# Patient Record
Sex: Female | Born: 1965
Health system: Southern US, Community
[De-identification: ages and names within clinical notes are randomized; demographics above are authoritative.]

## PROBLEM LIST (undated history)

## (undated) DIAGNOSIS — R002 Palpitations: Secondary | ICD-10-CM

## (undated) DIAGNOSIS — D696 Thrombocytopenia, unspecified: Secondary | ICD-10-CM

## (undated) DIAGNOSIS — N951 Menopausal and female climacteric states: Secondary | ICD-10-CM

## (undated) DIAGNOSIS — K5900 Constipation, unspecified: Secondary | ICD-10-CM

## (undated) DIAGNOSIS — K589 Irritable bowel syndrome without diarrhea: Secondary | ICD-10-CM

## (undated) DIAGNOSIS — R011 Cardiac murmur, unspecified: Secondary | ICD-10-CM

## (undated) DIAGNOSIS — R5383 Other fatigue: Secondary | ICD-10-CM

## (undated) DIAGNOSIS — K219 Gastro-esophageal reflux disease without esophagitis: Secondary | ICD-10-CM

## (undated) HISTORY — DX: Other fatigue: R53.83

## (undated) HISTORY — DX: Thrombocytopenia, unspecified: D69.6

## (undated) HISTORY — PX: BREAST FIBROADENOMA SURGERY: SHX580

## (undated) HISTORY — PX: RHINOPLASTY: SUR1284

## (undated) HISTORY — DX: Irritable bowel syndrome, unspecified: K58.9

## (undated) HISTORY — DX: Constipation, unspecified: K59.00

## (undated) HISTORY — DX: Menopausal and female climacteric states: N95.1

## (undated) HISTORY — DX: Cardiac murmur, unspecified: R01.1

## (undated) HISTORY — DX: Palpitations: R00.2

## (undated) HISTORY — DX: Gastro-esophageal reflux disease without esophagitis: K21.9

## (undated) HISTORY — PX: BREAST BIOPSY: SHX20

---

## 2008-05-01 ENCOUNTER — Emergency Department (HOSPITAL_COMMUNITY): Admission: EM | Admit: 2008-05-01 | Discharge: 2008-05-01 | Payer: Self-pay | Admitting: Emergency Medicine

## 2008-06-23 ENCOUNTER — Other Ambulatory Visit: Admission: RE | Admit: 2008-06-23 | Discharge: 2008-06-23 | Payer: Self-pay | Admitting: Obstetrics and Gynecology

## 2008-06-30 ENCOUNTER — Encounter: Admission: RE | Admit: 2008-06-30 | Discharge: 2008-06-30 | Payer: Self-pay | Admitting: Obstetrics and Gynecology

## 2009-05-27 ENCOUNTER — Encounter: Admission: RE | Admit: 2009-05-27 | Discharge: 2009-05-27 | Payer: Self-pay | Admitting: Obstetrics and Gynecology

## 2010-12-14 ENCOUNTER — Other Ambulatory Visit: Payer: Self-pay | Admitting: Family Medicine

## 2010-12-25 ENCOUNTER — Ambulatory Visit
Admission: RE | Admit: 2010-12-25 | Discharge: 2010-12-25 | Disposition: A | Discharge status: Home / Self Care | Payer: BC Managed Care – PPO | Source: Ambulatory Visit | Attending: Family Medicine | Admitting: Family Medicine

## 2012-08-24 IMAGING — RF DG ESOPHAGUS
16 series · 16 of 16 positions shown · non-contrast
Comparison: No similar prior study is available for comparison.

CLINICAL DATA: Dysphagia, difficulty swallowing

ESOPHOGRAM / BARIUM SWALLOW / BARIUM TABLET STUDY
TECHNIQUE: Combined double contrast and single contrast
examination performed using effervescent crystals, thick barium
liquid, and thin barium liquid.  The patient was observed with
fluoroscopy swallowing a 13mm barium sulphate tablet.
Fluoroscopy time:  2.1 minutes.

[Series 1: run · 1 of 1 slices shown (1 of 16)]
[im 1/1]
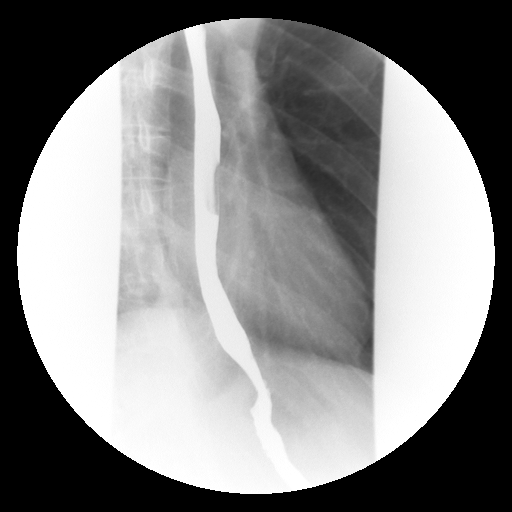

[Series 2: run · 1 of 1 slices shown (2 of 16)]
[im 1/1]
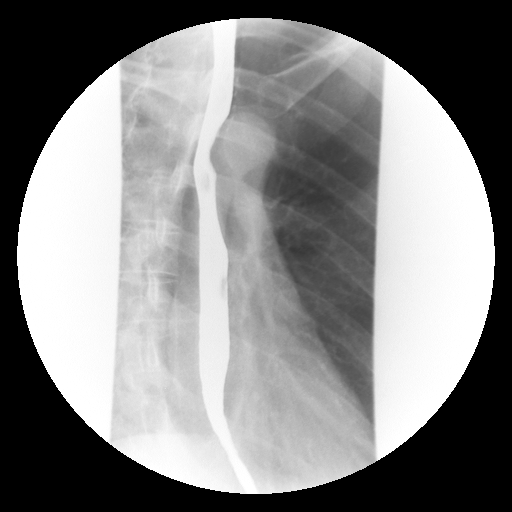

[Series 3: run · 1 of 1 slices shown (3 of 16)]
[im 1/1]
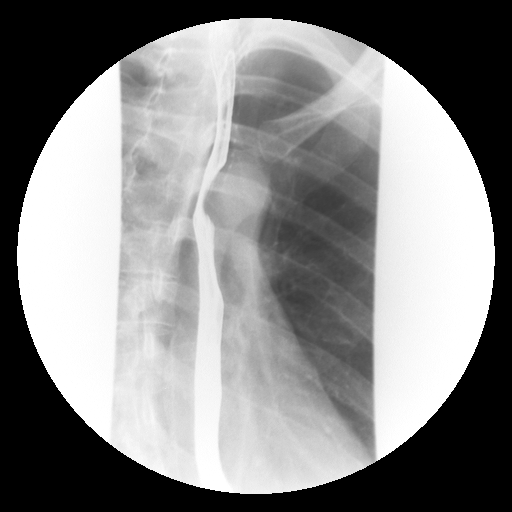

[Series 4: run · 1 of 1 slices shown (4 of 16)]
[im 1/1]
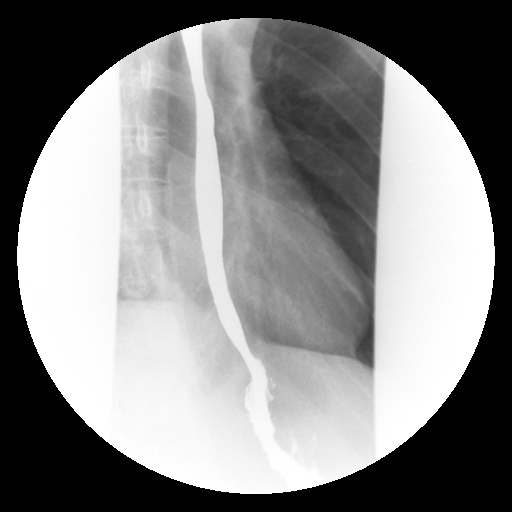

[Series 5: run · 1 of 1 slices shown (5 of 16)]
[im 1/1]
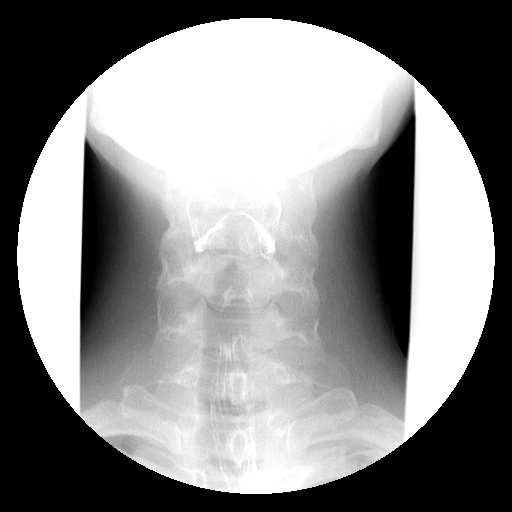

[Series 6: run · 1 of 1 slices shown (6 of 16)]
[im 1/1]
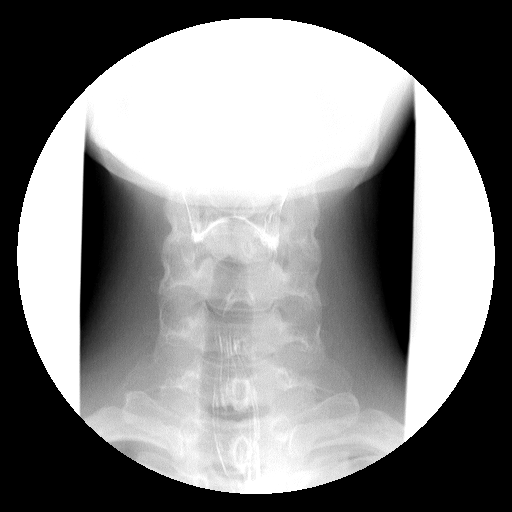

[Series 7: run · 1 of 1 slices shown (7 of 16)]
[im 1/1]
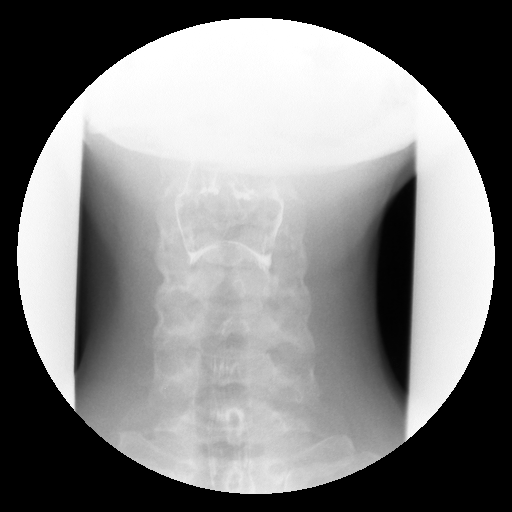

[Series 8: run · 1 of 1 slices shown (8 of 16)]
[im 1/1]
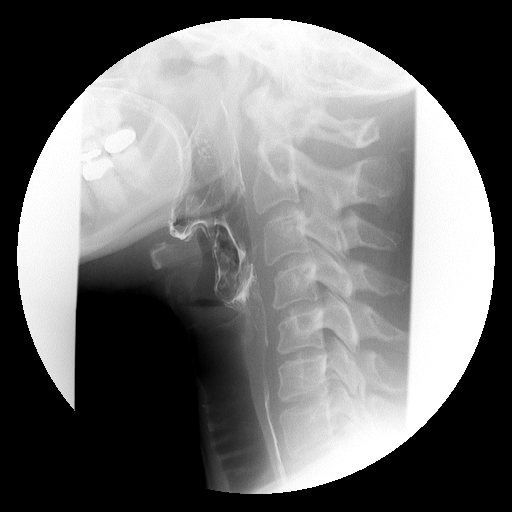

[Series 9: run · 1 of 1 slices shown (9 of 16)]
[im 1/1]
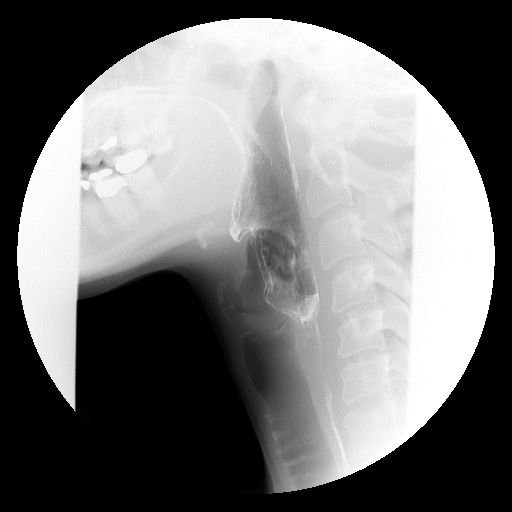

[Series 10: run · 1 of 1 slices shown (10 of 16)]
[im 1/1]
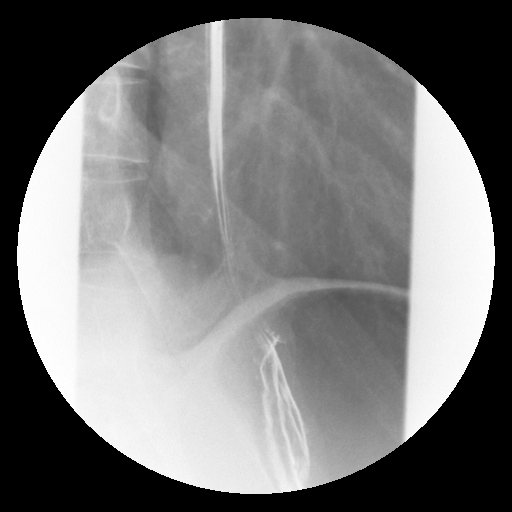

[Series 11: run · 1 of 1 slices shown (11 of 16)]
[im 1/1]
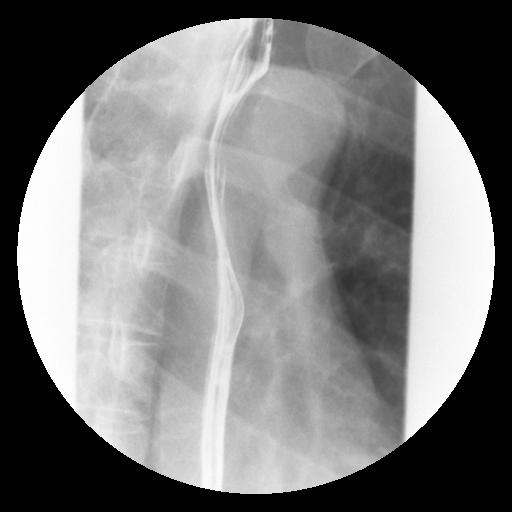

[Series 12: run · 1 of 1 slices shown (12 of 16)]
[im 1/1]
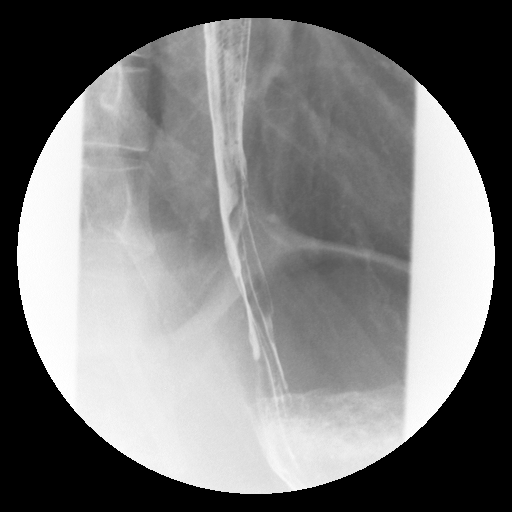

[Series 13: run · 1 of 1 slices shown (13 of 16)]
[im 1/1]
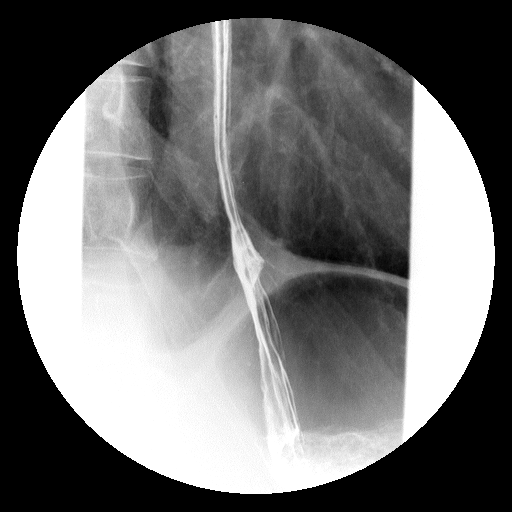

[Series 14: run · 1 of 1 slices shown (14 of 16)]
[im 1/1]
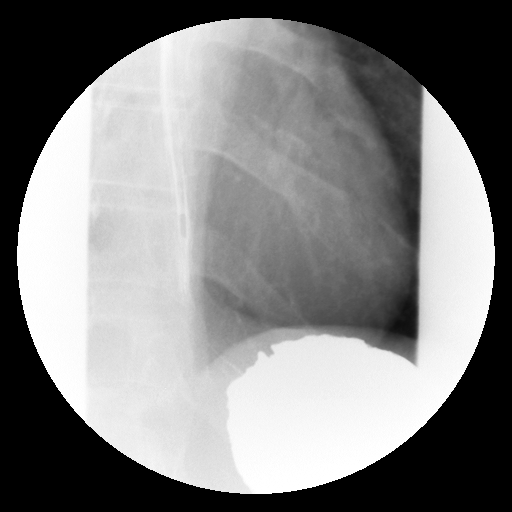

[Series 15: run · 1 of 1 slices shown (15 of 16)]
[im 1/1]
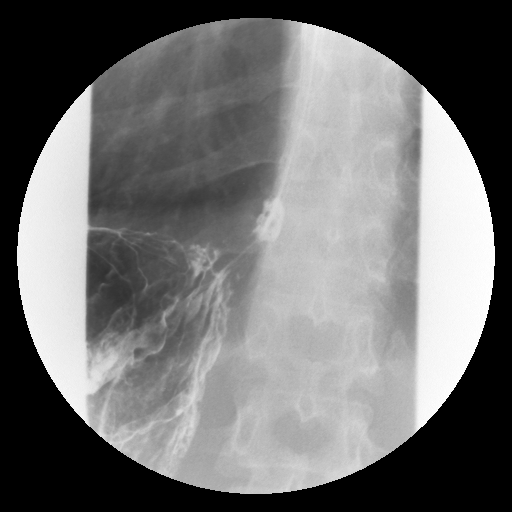

[Series 16: run · 1 of 1 slices shown (16 of 16)]
[im 1/1]
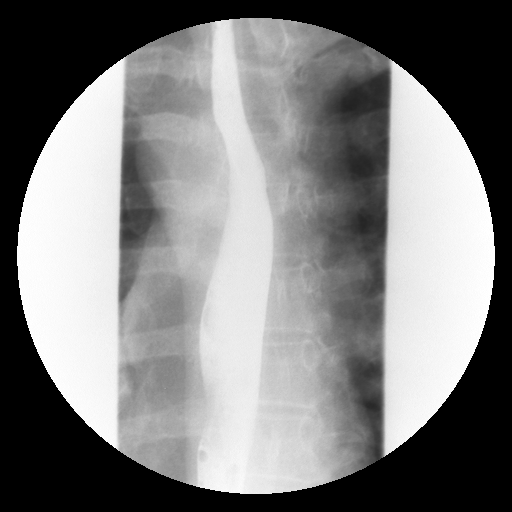

[16 of 16 positions shown; findings below may reference images not displayed]

FINDINGS: The esophagus is normal in course and caliber.  No
intrinsic or extrinsic filling defect is seen.  No mucosal
abnormality.  A barium tablet passed without difficulty.  The
piriform sinuses and valleculae are normal.  GE junction is normal.
IMPRESSION: Normal exam.

## 2014-07-12 ENCOUNTER — Other Ambulatory Visit (HOSPITAL_COMMUNITY)
Admission: RE | Admit: 2014-07-12 | Discharge: 2014-07-12 | Disposition: A | Discharge status: Home / Self Care | Payer: BC Managed Care – PPO | Source: Ambulatory Visit | Attending: Obstetrics & Gynecology | Admitting: Obstetrics & Gynecology

## 2014-07-12 ENCOUNTER — Other Ambulatory Visit: Payer: Self-pay | Admitting: Obstetrics & Gynecology

## 2014-07-12 DIAGNOSIS — Z01419 Encounter for gynecological examination (general) (routine) without abnormal findings: Secondary | ICD-10-CM | POA: Diagnosis not present

## 2014-07-12 DIAGNOSIS — Z1151 Encounter for screening for human papillomavirus (HPV): Secondary | ICD-10-CM | POA: Insufficient documentation

## 2014-07-13 LAB — CYTOLOGY - PAP

## 2015-02-03 ENCOUNTER — Encounter: Payer: Self-pay | Admitting: *Deleted

## 2015-02-03 ENCOUNTER — Ambulatory Visit (INDEPENDENT_AMBULATORY_CARE_PROVIDER_SITE_OTHER): Payer: 59 | Admitting: Cardiology

## 2015-02-03 VITALS — BP 120/64 | HR 53 | Ht 67.0 in

## 2015-02-03 DIAGNOSIS — R002 Palpitations: Secondary | ICD-10-CM | POA: Diagnosis not present

## 2015-02-03 DIAGNOSIS — R011 Cardiac murmur, unspecified: Secondary | ICD-10-CM | POA: Diagnosis not present

## 2015-02-03 NOTE — Patient Instructions (Signed)
Medication Instructions:  No changes today  Labwork: None today  Testing/Procedures: None today  Follow-Up: As needed with Dr Shirlee LatchMcLean.   Any Other Special Instructions Will Be Listed Below (If Applicable).  Call our office if you decide you want to have a 24 hour holter monitor.

## 2015-02-03 NOTE — Progress Notes (Signed)
Patient ID: Elizabeth Craig, female   DOB: 02/26/1966, 49 y.o.   MRN: [id number] PCP: Dr. Foy GuadalajaraFried  49 yo with past history of heart murmur presents for cardiology evaluation.  She had a soft murmur heard on exam over 10 years ago.  She had an echo at that time in [place] and was told that everything looked ok.  More recently, she has been noticing palpitations.  This has been going on roughly about as long as she has been taking a very heavy exercise class three times a week.  She will sweat a lot doing this class.  She does not drink much fluid.  When she goes to bed after doing these classes, she will notice a sensation of her heart occasionally "pounding" if she lies on her left side.  She only notices this at night when she is in bed and not during they day. No lightheadedness.  No prolonged tachypalpitations.  She has good exercise tolerance, no exertional dyspnea or chest pain.   ECG: NSR, normal   Labs (3/16): K 3.9, creatinine 0.84, B12 normal, TSH normal, LDL 63, HDL 48, HCT 42.7, plts 125  PMH: 1. GERD 2. Heart murmur: Had echo about 10 years ago in [place], was told it was normal. 3. Chronic mild thrombocytopenia  SH: Married, nonsmoker, rare ETOH  FH: Mother with MI at 8158.   ROS: All systems reviewed and negative except as per HPI.   Current Outpatient Prescriptions  Medication Sig Dispense Refill  . ranitidine (ZANTAC) 150 MG capsule Take 150 mg by mouth 2 (two) times daily.     No current facility-administered medications for this visit.   BP 120/64 mmHg  Pulse 53  Ht 5\' 7"  (1.702 m) General: NAD Neck: No JVD, no thyromegaly or thyroid nodule.  Lungs: Clear to auscultation bilaterally with normal respiratory effort. CV: Nondisplaced PMI.  Heart regular S1/S2, no S3/S4, 1/6 early SEM RUSB.  No peripheral edema.  No carotid bruit.  Normal pedal pulses.  Abdomen: Soft, nontender, no hepatosplenomegaly, no distention.  Skin: Intact without lesions or rashes.  Neurologic:  Alert and oriented x 3.  Psych: Normal affect. Extremities: No clubbing or cyanosis.  HEENT: Normal.   Assessment/Plan: 1. Palpitations: She notices these primarily at night lying on her left side.  I suspect that these are PVCs or PACs.  She has been noticing them since starting a strenuous exercise routine.  I suspect that she may be getting a bit dehydrated with lower K and Mg levels on days she does her exercise class (sweats a lot and does not drink a lot).  This may bring on the PACs and PVCs.  I suggested that she try to keep herself hydrated on these days with Gatorade or some other electrolyte-rich drink and see if these helps with the palpitations.  As they are currently, they are not particularly bothersome.  If they get worse, she will call and I will arrange for a holter monitor.  2. Heart murmur: She has a very soft systolic aortic-area ejection murmur. I doubt that there is significant valvular pathology.   Marca AnconaDalton Ayane Delancey 02/03/2015

## 2016-03-05 ENCOUNTER — Other Ambulatory Visit: Payer: Self-pay | Admitting: Obstetrics & Gynecology

## 2016-03-05 DIAGNOSIS — N6310 Unspecified lump in the right breast, unspecified quadrant: Secondary | ICD-10-CM

## 2016-03-19 ENCOUNTER — Ambulatory Visit
Admission: RE | Admit: 2016-03-19 | Discharge: 2016-03-19 | Disposition: A | Discharge status: Home / Self Care | Payer: 59 | Source: Ambulatory Visit | Attending: Obstetrics & Gynecology | Admitting: Obstetrics & Gynecology

## 2016-03-19 DIAGNOSIS — N6310 Unspecified lump in the right breast, unspecified quadrant: Secondary | ICD-10-CM

## 2016-05-17 ENCOUNTER — Encounter (HOSPITAL_BASED_OUTPATIENT_CLINIC_OR_DEPARTMENT_OTHER): Payer: Self-pay | Admitting: *Deleted

## 2016-05-17 ENCOUNTER — Emergency Department (HOSPITAL_BASED_OUTPATIENT_CLINIC_OR_DEPARTMENT_OTHER)
Admission: EM | Admit: 2016-05-17 | Discharge: 2016-05-17 | Disposition: A | Discharge status: Home / Self Care | Payer: 59 | Attending: Emergency Medicine | Admitting: Emergency Medicine

## 2016-05-17 DIAGNOSIS — R300 Dysuria: Secondary | ICD-10-CM | POA: Diagnosis present

## 2016-05-17 DIAGNOSIS — N390 Urinary tract infection, site not specified: Secondary | ICD-10-CM

## 2016-05-17 LAB — URINALYSIS, ROUTINE W REFLEX MICROSCOPIC
Bilirubin Urine: NEGATIVE
GLUCOSE, UA: NEGATIVE mg/dL
KETONES UR: NEGATIVE mg/dL
Nitrite: POSITIVE — AB
PH: 6.5 (ref 5.0–8.0)
PROTEIN: NEGATIVE mg/dL
Specific Gravity, Urine: 1.002 — ABNORMAL LOW (ref 1.005–1.030)

## 2016-05-17 LAB — URINE MICROSCOPIC-ADD ON

## 2016-05-17 LAB — PREGNANCY, URINE: Preg Test, Ur: NEGATIVE

## 2016-05-17 MED ORDER — CEPHALEXIN 500 MG PO CAPS: 500.0000 mg | ORAL_CAPSULE | Freq: Two times a day (BID) | ORAL | 0 refills | Status: AC

## 2016-05-17 NOTE — ED Triage Notes (Signed)
Dysuria and frequency today. No relief with AZO.

## 2016-05-17 NOTE — ED Provider Notes (Signed)
[facility] Note   CSN: [id number] Arrival date & time: 05/17/16  2048   By signing my name below, I, Christy Sartorius, attest that this documentation has been prepared under the direction and in the presence of Rolan Bucco, MD . Electronically Signed: Christy Sartorius, Scribe. 05/17/2016. 9:13 PM.  History   Chief Complaint Chief Complaint  Patient presents with  . Dysuria  The history is provided by the patient and medical records. No language interpreter was used.     HPI Comments:  Elizabeth Craig is a 50 y.o. female who presents to the Emergency Department complaining of dysuria beginning today.  She states she has a burning pain when urinating or sitting.  She notes associated urgency and increased urination.  Pt reports she gets UTIs somewhat frequently--especially after sex, and that this feels similar.  No alleviating factors noted.  She denies vaginal discharge, vaginal bleeding, nausea, vomiting and back pain.   Past Medical History:  Diagnosis Date  . Fatigue   . Heart murmur    hx of  . Palpitations     Patient Active Problem List   Diagnosis Date Noted  . Palpitations 02/03/2015  . Murmur, cardiac 02/03/2015    Past Surgical History:  Procedure Laterality Date  . CESAREAN SECTION      OB History    No data available       Home Medications    Prior to Admission medications   Medication Sig Start Date End Date Taking? Authorizing Provider  cephALEXin (KEFLEX) 500 MG capsule Take 1 capsule (500 mg total) by mouth 2 (two) times daily. 05/17/16   Rolan Bucco, MD  ranitidine (ZANTAC) 150 MG capsule Take 150 mg by mouth 2 (two) times daily.    Historical Provider, MD    Family History Family History  Problem Relation Age of Onset  . CAD    . Heart Problems    . Stroke      Social History Social History  Substance Use Topics  . Smoking status: Never Smoker  . Smokeless tobacco: Never Used  . Alcohol use No      Allergies   Review of patient's allergies indicates no known allergies.   Review of Systems Review of Systems  Constitutional: Negative for chills, diaphoresis, fatigue and fever.  HENT: Negative for congestion, rhinorrhea and sneezing.   Eyes: Negative.   Respiratory: Negative for cough, chest tightness and shortness of breath.   Cardiovascular: Negative for chest pain and leg swelling.  Gastrointestinal: Negative for abdominal pain, blood in stool, diarrhea, nausea and vomiting.  Genitourinary: Positive for dysuria, frequency and urgency. Negative for difficulty urinating, flank pain, hematuria, vaginal bleeding and vaginal discharge.  Musculoskeletal: Negative for arthralgias and back pain.  Skin: Negative for rash.  Neurological: Negative for dizziness, speech difficulty, weakness, numbness and headaches.  All other systems reviewed and are negative.    Physical Exam Updated Vital Signs BP 126/75 (BP Location: Right Arm)   Pulse (!) 51   Temp 98 F (36.7 C) (Oral)   Resp 18   Ht 5\' 7"  (1.702 m)   Wt 118 lb (53.5 kg)   LMP 05/10/2016   SpO2 100%   BMI 18.48 kg/m   Physical Exam  Constitutional: She is oriented to person, place, and time. She appears well-developed and well-nourished.  HENT:  Head: Normocephalic and atraumatic.  Eyes: Pupils are equal, round, and reactive to light.  Neck: Normal range of motion. Neck supple.  Cardiovascular: Normal rate,  regular rhythm and normal heart sounds.   Pulmonary/Chest: Effort normal and breath sounds normal. No respiratory distress. She has no wheezes. She has no rales. She exhibits no tenderness.  Abdominal: Soft. Bowel sounds are normal. There is no tenderness. There is no rebound and no guarding.  No CVA tenderness  Musculoskeletal: Normal range of motion. She exhibits no edema.  Lymphadenopathy:    She has no cervical adenopathy.  Neurological: She is alert and oriented to person, place, and time.  Skin: Skin is  warm and dry. No rash noted.  Psychiatric: She has a normal mood and affect.     ED Treatments / Results   DIAGNOSTIC STUDIES:  Oxygen Saturation is 100% on RA, NML by my interpretation.    COORDINATION OF CARE:  9:13 PM Discussed treatment plan with pt at bedside and pt agreed to plan.  Labs (all labs ordered are listed, but only abnormal results are displayed) Labs Reviewed  URINALYSIS, ROUTINE W REFLEX MICROSCOPIC (NOT AT [facility]) - Abnormal; Notable for the following:       Result Value   Color, Urine AMBER (*)    APPearance CLOUDY (*)    Specific Gravity, Urine 1.002 (*)    Hgb urine dipstick SMALL (*)    Nitrite POSITIVE (*)    Leukocytes, UA MODERATE (*)    All other components within normal limits  URINE MICROSCOPIC-ADD ON - Abnormal; Notable for the following:    Squamous Epithelial / LPF 0-5 (*)    Bacteria, UA FEW (*)    All other components within normal limits  PREGNANCY, URINE    EKG  EKG Interpretation None       Radiology No results found.  Procedures Procedures (including critical care time)  Medications Ordered in ED Medications - No data to display   Initial Impression / Assessment and Plan / ED Course  I have reviewed the triage vital signs and the nursing notes.  Pertinent labs & imaging results that were available during my care of the patient were reviewed by me and considered in my medical decision making (see chart for details).  Clinical Course    Patient with findings consistent with a urinary tract infection. She does not appear systemically ill. Her pregnancy test is negative. She was started on Keflex. She was encouraged to follow-up with her PCP if her symptoms are not improving. Return precautions were given.  Final Clinical Impressions(s) / ED Diagnoses   Final diagnoses:  UTI (lower urinary tract infection)    New Prescriptions New Prescriptions   CEPHALEXIN (KEFLEX) 500 MG CAPSULE    Take 1 capsule (500 mg total) by  mouth 2 (two) times daily.   I personally performed the services described in this documentation, which was scribed in my presence.  The recorded information has been reviewed and considered.     Rolan BuccoMelanie Gean Larose, MD 05/17/16 2123

## 2016-05-25 ENCOUNTER — Telehealth (HOSPITAL_BASED_OUTPATIENT_CLINIC_OR_DEPARTMENT_OTHER): Payer: Self-pay | Admitting: *Deleted

## 2016-05-25 NOTE — Telephone Encounter (Signed)
Pt called s/p ED visit on 05/17/2016 for UTI. States she has completed antibiotics and Sx have improved but are not completely gone. Requesting additional Rx.. Advised pt to f/u with PCP as directed on D/c instructions or to return to ED for re-evaluation.

## 2018-07-28 DIAGNOSIS — Z01411 Encounter for gynecological examination (general) (routine) with abnormal findings: Secondary | ICD-10-CM | POA: Diagnosis not present

## 2018-10-31 DIAGNOSIS — Z0000 Encounter for general adult medical examination without abnormal findings: Secondary | ICD-10-CM | POA: Diagnosis not present

## 2018-10-31 DIAGNOSIS — R002 Palpitations: Secondary | ICD-10-CM | POA: Diagnosis not present

## 2018-10-31 DIAGNOSIS — Z13220 Encounter for screening for lipoid disorders: Secondary | ICD-10-CM | POA: Diagnosis not present

## 2018-11-03 DIAGNOSIS — F4323 Adjustment disorder with mixed anxiety and depressed mood: Secondary | ICD-10-CM | POA: Diagnosis not present

## 2018-11-12 DIAGNOSIS — F4323 Adjustment disorder with mixed anxiety and depressed mood: Secondary | ICD-10-CM | POA: Diagnosis not present

## 2018-11-28 ENCOUNTER — Ambulatory Visit: Payer: 59 | Admitting: Cardiology

## 2018-12-02 DIAGNOSIS — R000 Tachycardia, unspecified: Secondary | ICD-10-CM | POA: Diagnosis not present

## 2018-12-02 DIAGNOSIS — R0789 Other chest pain: Secondary | ICD-10-CM | POA: Diagnosis not present

## 2019-07-31 DIAGNOSIS — N951 Menopausal and female climacteric states: Secondary | ICD-10-CM | POA: Diagnosis not present

## 2019-07-31 DIAGNOSIS — N390 Urinary tract infection, site not specified: Secondary | ICD-10-CM | POA: Diagnosis not present

## 2019-07-31 DIAGNOSIS — Z01411 Encounter for gynecological examination (general) (routine) with abnormal findings: Secondary | ICD-10-CM | POA: Diagnosis not present

## 2019-07-31 DIAGNOSIS — Z13220 Encounter for screening for lipoid disorders: Secondary | ICD-10-CM | POA: Diagnosis not present

## 2019-07-31 DIAGNOSIS — N898 Other specified noninflammatory disorders of vagina: Secondary | ICD-10-CM | POA: Diagnosis not present

## 2019-07-31 DIAGNOSIS — Z131 Encounter for screening for diabetes mellitus: Secondary | ICD-10-CM | POA: Diagnosis not present

## 2019-08-24 DIAGNOSIS — Z20828 Contact with and (suspected) exposure to other viral communicable diseases: Secondary | ICD-10-CM | POA: Diagnosis not present

## 2019-08-24 DIAGNOSIS — U071 COVID-19: Secondary | ICD-10-CM | POA: Diagnosis not present

## 2019-12-03 DIAGNOSIS — Z03818 Encounter for observation for suspected exposure to other biological agents ruled out: Secondary | ICD-10-CM | POA: Diagnosis not present

## 2019-12-03 DIAGNOSIS — Z20828 Contact with and (suspected) exposure to other viral communicable diseases: Secondary | ICD-10-CM | POA: Diagnosis not present

## 2019-12-07 DIAGNOSIS — Z1152 Encounter for screening for COVID-19: Secondary | ICD-10-CM | POA: Diagnosis not present

## 2019-12-07 DIAGNOSIS — Z03818 Encounter for observation for suspected exposure to other biological agents ruled out: Secondary | ICD-10-CM | POA: Diagnosis not present

## 2020-01-20 ENCOUNTER — Other Ambulatory Visit: Payer: Self-pay | Admitting: Family Medicine

## 2020-01-20 DIAGNOSIS — Z1231 Encounter for screening mammogram for malignant neoplasm of breast: Secondary | ICD-10-CM

## 2020-06-03 DIAGNOSIS — Z03818 Encounter for observation for suspected exposure to other biological agents ruled out: Secondary | ICD-10-CM | POA: Diagnosis not present

## 2020-08-25 DIAGNOSIS — Z20822 Contact with and (suspected) exposure to covid-19: Secondary | ICD-10-CM | POA: Diagnosis not present

## 2020-08-27 DIAGNOSIS — U071 COVID-19: Secondary | ICD-10-CM | POA: Diagnosis not present

## 2020-10-06 ENCOUNTER — Ambulatory Visit (INDEPENDENT_AMBULATORY_CARE_PROVIDER_SITE_OTHER): Payer: BC Managed Care – PPO

## 2020-10-06 ENCOUNTER — Ambulatory Visit (INDEPENDENT_AMBULATORY_CARE_PROVIDER_SITE_OTHER): Payer: BC Managed Care – PPO | Admitting: Orthopaedic Surgery

## 2020-10-06 ENCOUNTER — Encounter: Payer: Self-pay | Admitting: Orthopaedic Surgery

## 2020-10-06 ENCOUNTER — Other Ambulatory Visit: Payer: Self-pay

## 2020-10-06 DIAGNOSIS — M25532 Pain in left wrist: Secondary | ICD-10-CM

## 2020-10-06 MED ORDER — HYDROCODONE-ACETAMINOPHEN 5-325 MG PO TABS: 1.0000 | ORAL_TABLET | Freq: Four times a day (QID) | ORAL | 0 refills | Status: DC | PRN

## 2020-10-06 NOTE — Progress Notes (Signed)
Office Visit Note   Patient: Elizabeth Craig           Date of Birth: 1965/09/16           MRN: [id number] Visit Date: 10/06/2020              Requested by: Elizabeth Elk, MD [address] [place],  [place] [address] PCP: Elizabeth Elk, MD   Assessment & Plan: Visit Diagnoses:  1. Pain in left wrist     Plan: Her x-rays demonstrate nondisplaced distal radius fracture.  There is less than a millimeter of depression of the dorsal rim of the lunate facet.  DRUJ is intact.  Radial height, inclination and volar angulation relatively preserved.  We will attempt nonoperative treatment.  We will place her in a sugar tong splint and sling as needed.  She is to elevate at all times.  Prescription for hydrocodone.  She will also take over-the-counter NSAIDs and use ice.  We will recheck her next week with repeat two-view x-rays of the left wrist in the splint.  We will also obtain vitamin D and calcium levels at the next visit given history of osteopenia.  Follow-Up Instructions: Return in about 1 week (around 10/13/2020).   Orders:  Orders Placed This Encounter  Procedures  . XR Wrist Complete Left   Meds ordered this encounter  Medications  . HYDROcodone-acetaminophen (NORCO) 5-325 MG tablet    Sig: Take 1-2 tablets by mouth every 6 (six) hours as needed.    Dispense:  30 tablet    Refill:  0      Procedures: No procedures performed   Clinical Data: No additional findings.   Subjective: Chief Complaint  Patient presents with  . Left Wrist - Pain    Elizabeth Craig is 55 year old female friend of my wife and fianc of Elizabeth Craig who comes in for acute injury to the left wrist from mechanical fall this morning just about an hour ago.  She tripped in her driveway and fell directly on outstretched hand.  She had immediate and severe pain.  She has some numbness and tingling.  She comes in for evaluation.  Denies any elbow or shoulder pain.   Review of  Systems   Objective: Vital Signs: There were no vitals taken for this visit.  Physical Exam  Ortho Exam Left wrist shows mild swelling.  No neurovascular compromise of the hand.  She has severe pain and guarding.  Moderate to severe pain with passive stretch of the fingers.  Muscular compartments are soft and compressible.  Elbow and shoulder exams are unremarkable.  Strong radial pulse.  Fingers warm and well-perfused. Specialty Comments:  No specialty comments available.  Imaging: XR Wrist Complete Left  Result Date: 10/06/2020 Distal radius fracture in acceptable alignment.    PMFS History: Patient Active Problem List   Diagnosis Date Noted  . Palpitations 02/03/2015  . Murmur, cardiac 02/03/2015   Past Medical History:  Diagnosis Date  . Fatigue   . Heart murmur    hx of  . Palpitations     Family History  Problem Relation Age of Onset  . CAD Unknown   . Heart Problems Unknown   . Stroke Unknown     Past Surgical History:  Procedure Laterality Date  . CESAREAN SECTION     Social History   Occupational History  . Not on file  Tobacco Use  . Smoking status: Never Smoker  . Smokeless tobacco: Never Used  Substance  and Sexual Activity  . Alcohol use: No    Alcohol/week: 0.0 standard drinks  . Drug use: No  . Sexual activity: Not on file

## 2020-10-12 ENCOUNTER — Ambulatory Visit (INDEPENDENT_AMBULATORY_CARE_PROVIDER_SITE_OTHER): Payer: 59

## 2020-10-12 ENCOUNTER — Encounter: Payer: Self-pay | Admitting: Orthopaedic Surgery

## 2020-10-12 ENCOUNTER — Other Ambulatory Visit: Payer: Self-pay

## 2020-10-12 ENCOUNTER — Ambulatory Visit (INDEPENDENT_AMBULATORY_CARE_PROVIDER_SITE_OTHER): Payer: 59 | Admitting: Orthopaedic Surgery

## 2020-10-12 DIAGNOSIS — S52572A Other intraarticular fracture of lower end of left radius, initial encounter for closed fracture: Secondary | ICD-10-CM

## 2020-10-12 DIAGNOSIS — M25521 Pain in right elbow: Secondary | ICD-10-CM | POA: Insufficient documentation

## 2020-10-12 DIAGNOSIS — S52502A Unspecified fracture of the lower end of left radius, initial encounter for closed fracture: Secondary | ICD-10-CM | POA: Insufficient documentation

## 2020-10-12 DIAGNOSIS — M7711 Lateral epicondylitis, right elbow: Secondary | ICD-10-CM

## 2020-10-12 NOTE — Progress Notes (Signed)
   Office Visit Note   Patient: Elizabeth Craig           Date of Birth: 1966/05/22           MRN: [id number] Visit Date: 10/12/2020              Requested by: Marinda Elk, MD [address] [place],  [place] [address] PCP: Marinda Elk, MD   Assessment & Plan: Visit Diagnoses:  1. Other closed intra-articular fracture of distal end of left radius, initial encounter   2. Right lateral epicondylitis     Plan: Stable left distal radius fracture ready for short arm cast at this time.  Continue elevation, nsaids, and norco prn.  Right lateral epicondylitis - will provide home exercises and resistance bands.  Rest as well.  Follow up in a week for repeat 2 view xrays of the left wrist in the cast.    Follow-Up Instructions: Return in about 1 week (around 10/19/2020).   Orders:  Orders Placed This Encounter  Procedures  . XR Wrist 2 Views Left  . XR Elbow Complete Right (3+View)   No orders of the defined types were placed in this encounter.     Procedures: No procedures performed   Clinical Data: No additional findings.   Subjective: Chief Complaint  Patient presents with  . Left Wrist - Follow-up, Pain  . Right Elbow - Pain    Elizabeth Craig returns today for 1 week follow up for left distal radius fracture and new problem of right elbow pain.  Pain and swelling are improving in the wrist.  Takes advil and occasional norco.  Right elbow has been hurting for 8 months.  Denies numbness tingling.  Has done some stretches.  Play a lot of tennis.   Review of Systems   Objective: Vital Signs: There were no vitals taken for this visit.  Physical Exam  Ortho Exam Minimal swelling to the hand and wrist.  No bruising.  No neurovascular compromise. Right elbow tenderness to lateral epicondyle.  Pain with resisted wrist extension and middle finger extension.  Negative tinel at radial tunnel Specialty Comments:  No specialty comments available.  Imaging: XR Elbow Complete  Right (3+View)  Result Date: 10/12/2020 Small calcification of the lateral epicondyle.  No acute abnormalities.  XR Wrist 2 Views Left  Result Date: 10/12/2020 Preserved alignment of left distal radius fracture.  No interval worsening.    PMFS History: Patient Active Problem List   Diagnosis Date Noted  . Closed fracture of left distal radius 10/12/2020  . Pain in right elbow 10/12/2020  . Palpitations 02/03/2015  . Murmur, cardiac 02/03/2015   Past Medical History:  Diagnosis Date  . Fatigue   . Heart murmur    hx of  . Palpitations     Family History  Problem Relation Age of Onset  . CAD Unknown   . Heart Problems Unknown   . Stroke Unknown     Past Surgical History:  Procedure Laterality Date  . CESAREAN SECTION     Social History   Occupational History  . Not on file  Tobacco Use  . Smoking status: Never Smoker  . Smokeless tobacco: Never Used  Substance and Sexual Activity  . Alcohol use: No    Alcohol/week: 0.0 standard drinks  . Drug use: No  . Sexual activity: Not on file

## 2020-10-17 ENCOUNTER — Ambulatory Visit: Payer: 59 | Admitting: Orthopaedic Surgery

## 2020-10-18 ENCOUNTER — Ambulatory Visit: Payer: 59 | Admitting: Orthopaedic Surgery

## 2020-10-20 ENCOUNTER — Telehealth: Payer: Self-pay

## 2020-10-20 NOTE — Telephone Encounter (Signed)
Needs f/u appt. Called patient no answer. LMOM.  She can f/u next week with Dr. Roda Shutters.

## 2020-10-25 ENCOUNTER — Ambulatory Visit (INDEPENDENT_AMBULATORY_CARE_PROVIDER_SITE_OTHER): Payer: 59 | Admitting: Orthopaedic Surgery

## 2020-10-25 ENCOUNTER — Encounter: Payer: Self-pay | Admitting: Orthopaedic Surgery

## 2020-10-25 ENCOUNTER — Ambulatory Visit (INDEPENDENT_AMBULATORY_CARE_PROVIDER_SITE_OTHER): Payer: 59

## 2020-10-25 VITALS — Ht 67.0 in | Wt 118.0 lb

## 2020-10-25 DIAGNOSIS — M25532 Pain in left wrist: Secondary | ICD-10-CM

## 2020-10-25 NOTE — Progress Notes (Signed)
    Patient: Elizabeth Craig           Date of Birth: 1966-09-10           MRN: [id number] Visit Date: 10/25/2020 PCP: Marinda Elk, MD   Assessment & Plan:  Chief Complaint:  Chief Complaint  Patient presents with  . Left Wrist - Follow-up    Left wrist fracture   Visit Diagnoses:  1. Pain in left wrist     Plan:   Foye returns today 2 days shy of 3 weeks for a distal radius fracture.  Doing well overall.  Has noticed significant improvement in symptoms over the last few days.  Has not taken any pain medicine since I last saw her.  Cast is well fitting.  Swelling is significantly improved.  X-rays demonstrate stable alignment of a nondisplaced distal radius fracture.  We will continue the cast for another week follow-up next Thursday or Friday for repeat two-view x-rays of the left wrist out of the cast.  Follow-Up Instructions: Return in about 1 week (around 11/01/2020).   Orders:  Orders Placed This Encounter  Procedures  . XR Wrist 2 Views Left   No orders of the defined types were placed in this encounter.   Imaging: XR Wrist 2 Views Left  Result Date: 10/25/2020 Stable distal radius fracture in the cast.  No interval changes.   PMFS History: Patient Active Problem List   Diagnosis Date Noted  . Closed fracture of left distal radius 10/12/2020  . Pain in right elbow 10/12/2020  . Palpitations 02/03/2015  . Murmur, cardiac 02/03/2015   Past Medical History:  Diagnosis Date  . Fatigue   . Heart murmur    hx of  . Palpitations     Family History  Problem Relation Age of Onset  . CAD Other   . Heart Problems Other   . Stroke Other     Past Surgical History:  Procedure Laterality Date  . CESAREAN SECTION     Social History   Occupational History  . Not on file  Tobacco Use  . Smoking status: Never Smoker  . Smokeless tobacco: Never Used  Substance and Sexual Activity  . Alcohol use: No    Alcohol/week: 0.0 standard drinks  . Drug use: No   . Sexual activity: Not on file

## 2020-11-03 ENCOUNTER — Other Ambulatory Visit: Payer: Self-pay

## 2020-11-03 ENCOUNTER — Ambulatory Visit (INDEPENDENT_AMBULATORY_CARE_PROVIDER_SITE_OTHER): Payer: 59 | Admitting: Orthopaedic Surgery

## 2020-11-03 ENCOUNTER — Encounter: Payer: Self-pay | Admitting: Orthopaedic Surgery

## 2020-11-03 ENCOUNTER — Ambulatory Visit (INDEPENDENT_AMBULATORY_CARE_PROVIDER_SITE_OTHER): Payer: 59

## 2020-11-03 DIAGNOSIS — M25532 Pain in left wrist: Secondary | ICD-10-CM

## 2020-11-03 NOTE — Progress Notes (Signed)
    Patient: Elizabeth Craig           Date of Birth: [birth year]           MRN: [id number] Visit Date: 11/03/2020 PCP: Marinda Elk, MD   Assessment & Plan:  Chief Complaint:  Chief Complaint  Patient presents with  . Left Wrist - Pain, Follow-up   Visit Diagnoses:  1. Pain in left wrist     Plan:   Brinae is 4 weeks status post left nondisplaced distal radius fracture.  She has some mild pain.  Cast removed today.  Takes Advil as needed.  Left wrist shows no swelling.  She does have apprehension to attempted range of motion.  No neurovascular compromise.  No real tenderness at the fracture site.  X-rays continue to demonstrate stable alignment with continued healing.  At this point we will discontinue the short arm cast and place her in a removable Velcro brace to wear at all times except for hygiene and hand and wrist therapy.  Acceptable activities and restrictions were reviewed today.  Recheck in 4 weeks with two-view x-rays of the left wrist.  Follow-Up Instructions: Return in about 4 weeks (around 12/01/2020).   Orders:  Orders Placed This Encounter  Procedures  . XR Wrist 2 Views Left  . Ambulatory referral to Occupational Therapy   No orders of the defined types were placed in this encounter.   Imaging: XR Wrist 2 Views Left  Result Date: 11/03/2020 Stable alignment of distal radius fracture.  There is healing callus formation on the dorsal cortex.   PMFS History: Patient Active Problem List   Diagnosis Date Noted  . Closed fracture of left distal radius 10/12/2020  . Pain in right elbow 10/12/2020  . Palpitations 02/03/2015  . Murmur, cardiac 02/03/2015   Past Medical History:  Diagnosis Date  . Fatigue   . Heart murmur    hx of  . Palpitations     Family History  Problem Relation Age of Onset  . CAD Other   . Heart Problems Other   . Stroke Other     Past Surgical History:  Procedure Laterality Date  . CESAREAN SECTION     Social History    Occupational History  . Not on file  Tobacco Use  . Smoking status: Never Smoker  . Smokeless tobacco: Never Used  Substance and Sexual Activity  . Alcohol use: No    Alcohol/week: 0.0 standard drinks  . Drug use: No  . Sexual activity: Not on file

## 2020-11-14 ENCOUNTER — Ambulatory Visit: Payer: 59 | Attending: Orthopaedic Surgery | Admitting: Occupational Therapy

## 2020-11-14 ENCOUNTER — Encounter: Payer: Self-pay | Admitting: Occupational Therapy

## 2020-11-14 ENCOUNTER — Other Ambulatory Visit: Payer: Self-pay

## 2020-11-14 DIAGNOSIS — M25521 Pain in right elbow: Secondary | ICD-10-CM | POA: Diagnosis present

## 2020-11-14 DIAGNOSIS — M25632 Stiffness of left wrist, not elsewhere classified: Secondary | ICD-10-CM | POA: Insufficient documentation

## 2020-11-14 DIAGNOSIS — M6281 Muscle weakness (generalized): Secondary | ICD-10-CM | POA: Insufficient documentation

## 2020-11-14 DIAGNOSIS — R278 Other lack of coordination: Secondary | ICD-10-CM | POA: Insufficient documentation

## 2020-11-14 DIAGNOSIS — M25532 Pain in left wrist: Secondary | ICD-10-CM | POA: Insufficient documentation

## 2020-11-14 DIAGNOSIS — M7711 Lateral epicondylitis, right elbow: Secondary | ICD-10-CM | POA: Diagnosis present

## 2020-11-14 NOTE — Therapy (Signed)
[facility] [address] [place], [place], [address] Phone: [phone]   Fax:  [phone]  Occupational Therapy Evaluation  Patient Details  Name: Elizabeth Craig MRN: [id number] Date of Birth: January 01, 1966 Referring Provider (OT): Gershon Mussel (MD)   Encounter Date: 11/14/2020   OT End of Session - 11/14/20 1518    Visit Number 1    Number of Visits 5    Date for OT Re-Evaluation 12/12/20    Authorization Type [employer]    Authorization Time Period VL: 30 PT/OT    OT Start Time 1446    OT Stop Time 1530    OT Time Calculation (min) 44 min    Activity Tolerance Patient tolerated treatment well    Behavior During Therapy [facility] for tasks assessed/performed           Past Medical History:  Diagnosis Date  . Fatigue   . Heart murmur    hx of  . Palpitations     Past Surgical History:  Procedure Laterality Date  . CESAREAN SECTION      There were no vitals filed for this visit.   Subjective Assessment - 11/14/20 1503    Subjective  Pt reports pain in right elbow d/t epicondylitis and overuse since distal radius fx in L wrist. Pt with pain in R elbow but does not report any pain in L wrist. Pt reports difficulty with taking tops off of bottles, putting hair up and lifting furniture in her job. 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    Pertinent History Lateral Epicondylitis RUE, Distal Radius Fx LUE    Patient Stated Goals functional right hand again "be able to hit a tennis ball"    Currently in Pain? Yes    Pain Score 8    lateral epicondylitis   Pain Location Elbow    Pain Orientation Right    Pain Descriptors / Indicators Sore    Pain Type Acute pain    Pain Onset 1 to 4 weeks ago    Pain Frequency Intermittent    Aggravating Factors  using it    Pain Relieving Factors friction/massage and ice              [facility] OT Assessment - 11/14/20 1454      Assessment   Medical Diagnosis L distal radius fx (non-op)    Referring Provider (OT) Gershon Mussel (MD)    Onset Date/Surgical Date 10/06/20   non surgical   Hand Dominance Right      Precautions   Required Braces or Orthoses Other Brace/Splint    Other Brace/Splint wrist brace for protection L      Home  Environment   Family/patient expects to be discharged to: Private residence    Additional Comments Patient's home layout does not impact overall daily living skills and independence with current condition.      Prior Function   Level of Independence Independent    Vocation Self employed    Vocation Requirements sell furniture    Leisure tennis, run      ADL   ADL comments Pt is independent with ADLs. Pt reports increased independence and ease with fine motor manipulation and clothing management since onset. Pt report dificulty with cutting up thick/tough food and putting up hair      IADL   Prior Level of Function Shopping Independent  Shopping Takes care of all shopping needs independently;Shops independently for small purchases   unable to lift heavier items with Left   Prior Level of Function Light Housekeeping Independent    Light Housekeeping Does personal laundry completely;Maintains house alone or with occasional assistance   unable to do things with L like used to   Prior Level of Function Meal Prep Independent    Meal Prep Able to complete simple warm meal prep;Able to complete simple cold meal and snack prep   difficulty with chopping vegetables etc   Prior Level of Function Arts administratorCommunity Mobility Independent    Community Mobility Drives own vehicle    Prior Level of Function Medication Managment Independent    Medication Management Is responsible for taking medication in correct dosages at correct time    Prior Level of Function Chemical engineerinancial Management Independent    Financial Management Manages financial matters independently  (budgets, writes checks, pays rent, bills goes to bank), collects and keeps track of income      Written Expression   Dominant Hand Right    Handwriting 100% legible      Observation/Other Assessments   Focus on Therapeutic Outcomes (FOTO)  N/A      Sensation   Light Touch Appears Intact    Hot/Cold Appears Intact      Coordination   9 Hole Peg Test Right;Left    Right 9 Hole Peg Test 18.44s    Left 9 Hole Peg Test 23.97s      ROM / Strength   AROM / PROM / Strength AROM      AROM   Overall AROM  Deficits    AROM Assessment Site Wrist;Forearm    Right/Left Forearm Left    Left Forearm Pronation 65 Degrees   RUE 90   Left Forearm Supination 80 Degrees   RUE 90   Right/Left Wrist Left    Left Wrist Extension 30 Degrees   RUE 45   Left Wrist Flexion 40 Degrees   RUE 75   Left Wrist Radial Deviation 20 Degrees   RUE 15   Left Wrist Ulnar Deviation 15 Degrees   RUE 35     Hand Function   Right Hand Gross Grasp Functional    Right Hand Grip (lbs) 60.1    Left Hand Gross Grasp Functional    Left Hand Grip (lbs) 14.3                                OT Long Term Goals - 11/14/20 1544      OT LONG TERM GOAL #1   Title Pt will be independent with HEPs for R elbow and L wrist range of motion/strengthening 12/12/20    Time 4    Period Weeks    Status New    Target Date 12/12/20      OT LONG TERM GOAL #2   Title Pt will demonstrate increased range of motion in LUE wrist by at least 5 degrees of flexion/extension for increasing functional use of LUE.    Baseline LUE wrist flex 40*/ext 30*    Time 4    Period Weeks    Status New      OT LONG TERM GOAL #3   Title Pt will report increased ease with opening containers, putting hair up and overall functional use of LUE for ADLs and IADLs    Time 4    Period Weeks  Status New      OT LONG TERM GOAL #4   Title Pt will demonstrate improved grip strength in LUE to 20 lbs or greater for increasing  functional use of LUE.    Baseline RUE 60.1, LUE 14.3    Time 4    Period Weeks    Status New      OT LONG TERM GOAL #5   Title Pt will report ability to hit tennis ball with backhard utilizing LUE as support for bilateral swing.    Time 4    Period Weeks    Status New      OT LONG TERM GOAL #6   Title Pt will report pain during tennis and other activities of 7/10 in RUE elbow.    Baseline 9/10 R elbow pain during tennis    Time 4    Period Weeks    Status New                 Plan - 11/14/20 1527    Clinical Impression Statement Pt is a 55 year old female that presents to neuro OPOT s/p distal radius fracture in L wrist. Fracture required non-operative management. Pt is now 5.5 weeks post onset of injury. Pt is wearing wrist brace PRN and was not wearing during evaluation. Pt also has dx of lateral epicondylitis in right elbow that is exacerbated with overuse d/t LUE wrist fx. Pt presents with difficulty with IADLs and ADLs secondary to decreased range of motion and strength in LUE and pain in BUE. Skilled OT is recommended to target listed areas of deficit in order to increase independence with ADLs and IADLs.    OT Occupational Profile and History Problem Focused Assessment - Including review of records relating to presenting problem    Occupational performance deficits (Please refer to evaluation for details): ADL's;IADL's;Leisure;Work    Games developer / Function / Physical Skills UE functional use;ROM;Flexibility;ADL;FMC;Dexterity;GMC;Pain;Strength;IADL    Rehab Potential Good    Clinical Decision Making Limited treatment options, no task modification necessary    Comorbidities Affecting Occupational Performance: None    Modification or Assistance to Complete Evaluation  No modification of tasks or assist necessary to complete eval    OT Frequency 1x / week    OT Duration 4 weeks   4 visits + eval   OT Treatment/Interventions Self-care/ADL  training;Cryotherapy;Ultrasound;Traction;Moist Heat;Electrical Stimulation;Paraffin;Fluidtherapy;DME and/or AE instruction;Therapeutic exercise;Patient/family education;Splinting;Therapeutic activities;Passive range of motion;Manual Therapy    Plan putty for LUE if time via protocol, ultrasound right elbow?    OT Home Exercise Plan AROM for wrist L, and R elbow and tendon glides    Consulted and Agree with Plan of Care Patient           Patient will benefit from skilled therapeutic intervention in order to improve the following deficits and impairments:   Body Structure / Function / Physical Skills: UE functional use,ROM,Flexibility,ADL,FMC,Dexterity,GMC,Pain,Strength,IADL       Visit Diagnosis: Pain in left wrist - Plan: Ot plan of care cert/re-cert  Stiffness of left wrist, not elsewhere classified - Plan: Ot plan of care cert/re-cert  Pain in right elbow - Plan: Ot plan of care cert/re-cert  Lateral epicondylitis of right elbow - Plan: Ot plan of care cert/re-cert  Muscle weakness (generalized) - Plan: Ot plan of care cert/re-cert  Other lack of coordination - Plan: Ot plan of care cert/re-cert    Problem List Patient Active Problem List   Diagnosis Date Noted  . Closed fracture of left  distal radius 10/12/2020  . Pain in right elbow 10/12/2020  . Palpitations 02/03/2015  . Murmur, cardiac 02/03/2015    Junious Dresser MOT, OTR/L  11/14/2020, 3:52 PM  [facility] [address] [place], [place], [address] Phone: [phone]   Fax:  [phone]  Name: Elizabeth Craig MRN: [id number] Date of Birth: 07-Feb-1966

## 2020-11-14 NOTE — Patient Instructions (Addendum)
AROM: Wrist Extension   .  With ____ palm down, bend wrist up. Repeat __15__ times per set.  Do __4-6__ sessions per day.    AROM: Wrist Flexion   With_____ palm up, bend wrist up. Repeat __15__ times per set.  Do _4-6___ sessions per day.   AROM: Forearm Pronation / Supination   With ____ arm in handshake position, slowly rotate palm down until stretch is felt. Relax. Then rotate palm up until stretch is felt. Repeat _15___ times per set. Do _4-6___ sessions per day.  Copyright  VHI. All rights reserved.     AROM: Elbow Flexion / Extension    Gently bend elbow as far as possible. Then straighten arm as far as possible. Repeat _10-15___ times per set. Do _4-6___ sessions per day.    Elbow / Wrist Supination / Pronation   Bend elbow(s) to 90 and hold close to body. Turn palm(s) up. Then turn palm(s) down. Keep wrist straight. Repeat sequence _10-15___ times per session. Do _4-6___ sessions per day.    Flexor Tendon Gliding (Active Hook Fist)   With fingers and knuckles straight, bend middle and tip joints. Do not bend large knuckles. Repeat _10-15___ times. Do _4-6___ sessions per day.  MP Flexion (Active)   With back of hand on table, bend large knuckles as far as they will go, keeping small joints straight. Repeat _10-15___ times. Do __4-6__ sessions per day. Activity: Reach into a narrow container.*      Finger Flexion / Extension   With palm up, bend fingers of left hand toward palm, making a  fist. Straighten fingers, opening fist. Repeat sequence _10-15___ times per session. Do _4-6__ sessions per day. Hand Variation: Palm down   Copyright  VHI. All rights reserved.

## 2020-11-21 ENCOUNTER — Ambulatory Visit: Payer: 59 | Admitting: Occupational Therapy

## 2020-11-28 ENCOUNTER — Ambulatory Visit: Payer: 59 | Admitting: Occupational Therapy

## 2020-12-02 ENCOUNTER — Ambulatory Visit: Payer: 59 | Admitting: Occupational Therapy

## 2020-12-05 ENCOUNTER — Ambulatory Visit: Payer: 59 | Admitting: Occupational Therapy

## 2020-12-12 ENCOUNTER — Ambulatory Visit: Payer: 59 | Admitting: Occupational Therapy

## 2020-12-15 NOTE — Therapy (Signed)
[facility] [address] [place], [place], [address] Phone: [phone]   Fax:  [phone]  Patient Details  Name: Kyira Volkert MRN: [id number] Date of Birth: Jul 31, 1966 Referring Provider:  Leandrew Koyanagi, MD  Encounter Date: 11/14/2020  OCCUPATIONAL THERAPY DISCHARGE SUMMARY  Visits from Start of Care: 1  Current functional level related to goals / functional outcomes: Pt did not achieve goals as they did not return following eval.   Remaining deficits: See eval   Education / Equipment: Education was not completed as pt did not return.  Plan: Patient agrees to discharge.  Patient goals were not met. Patient is being discharged due to not returning since the last visit.  ?????         Zachery Conch MOT, OTR/L  12/15/2020, 9:51 AM  [facility] [address] [place], [place], [address] Phone: [phone]   Fax:  [phone]

## 2021-04-19 ENCOUNTER — Other Ambulatory Visit: Payer: Self-pay | Admitting: Nurse Practitioner

## 2021-04-19 DIAGNOSIS — Z1231 Encounter for screening mammogram for malignant neoplasm of breast: Secondary | ICD-10-CM

## 2021-11-07 ENCOUNTER — Other Ambulatory Visit: Payer: Self-pay | Admitting: Family Medicine

## 2021-11-07 DIAGNOSIS — Z1231 Encounter for screening mammogram for malignant neoplasm of breast: Secondary | ICD-10-CM

## 2022-06-26 NOTE — Progress Notes (Signed)
No chief complaint on file.     History of Present Illness: 56 yo female with history of palpitations who is here today as a new consult, referred by Dr. ***, for the evaluation of *** She reports having been told that she has a murmur for many years. She was seen in 2016 in our office by Dr. Shirlee Latch for workup of a murmur and palpitations. No cardiac workup at that time.   Primary Care Physician: Marinda Elk, MD   Past Medical History:  Diagnosis Date   Fatigue    Heart murmur    hx of   Palpitations     Past Surgical History:  Procedure Laterality Date   CESAREAN SECTION      Current Outpatient Medications  Medication Sig Dispense Refill   cephALEXin (KEFLEX) 500 MG capsule Take 1 capsule (500 mg total) by mouth 2 (two) times daily. 14 capsule 0   HYDROcodone-acetaminophen (NORCO) 5-325 MG tablet Take 1-2 tablets by mouth every 6 (six) hours as needed. (Patient not taking: Reported on 10/25/2020) 30 tablet 0   ranitidine (ZANTAC) 150 MG capsule Take 150 mg by mouth 2 (two) times daily.     No current facility-administered medications for this visit.    No Known Allergies  Social History   Socioeconomic History   Marital status: Married    Spouse name: Not on file   Number of children: Not on file   Years of education: Not on file   Highest education level: Not on file  Occupational History   Not on file  Tobacco Use   Smoking status: Never   Smokeless tobacco: Never  Substance and Sexual Activity   Alcohol use: No    Alcohol/week: 0.0 standard drinks of alcohol   Drug use: No   Sexual activity: Not on file  Other Topics Concern   Not on file  Social History Narrative   Not on file   Social Determinants of Health   Financial Resource Strain: Not on file  Food Insecurity: Not on file  Transportation Needs: Not on file  Physical Activity: Not on file  Stress: Not on file  Social Connections: Not on file  Intimate Partner Violence: Not on file     Family History  Problem Relation Age of Onset   CAD Other    Heart Problems Other    Stroke Other     Review of Systems:  As stated in the HPI and otherwise negative.   There were no vitals taken for this visit.  Physical Examination: General: Well developed, well nourished, NAD  HEENT: OP clear, mucus membranes moist  SKIN: warm, dry. No rashes. Neuro: No focal deficits  Musculoskeletal: Muscle strength 5/5 all ext  Psychiatric: Mood and affect normal  Neck: No JVD, no carotid bruits, no thyromegaly, no lymphadenopathy.  Lungs:Clear bilaterally, no wheezes, rhonci, crackles Cardiovascular: Regular rate and rhythm. No murmurs, gallops or rubs. Abdomen:Soft. Bowel sounds present. Non-tender.  Extremities: No lower extremity edema. Pulses are 2 + in the bilateral DP/PT.  EKG:  EKG {ACTION; IS/IS HAL:[id number]} ordered today. The ekg ordered today demonstrates ***  Recent Labs: No results found for requested labs within last 365 days.   Lipid Panel No results found for: "CHOL", "TRIG", "HDL", "CHOLHDL", "VLDL", "LDLCALC", "LDLDIRECT"   Wt Readings from Last 3 Encounters:  10/25/20 118 lb (53.5 kg)  05/17/16 118 lb (53.5 kg)      Assessment and Plan:   1.   Labs/ tests ordered  today include:  No orders of the defined types were placed in this encounter.    Disposition:   F/U with me in ***    Signed, Lauree Chandler, MD, [facility] 06/26/2022 3:32 PM    [facility] [place], [place], [place]  [address] Phone: [phone]; Fax: [phone]

## 2022-06-27 ENCOUNTER — Ambulatory Visit: Payer: Commercial Managed Care - HMO | Attending: Cardiovascular Disease | Admitting: Cardiovascular Disease

## 2022-06-27 ENCOUNTER — Ambulatory Visit: Payer: Commercial Managed Care - HMO | Attending: Cardiovascular Disease

## 2022-06-27 VITALS — BP 124/78 | HR 60 | Ht 67.0 in | Wt 125.6 lb

## 2022-06-27 DIAGNOSIS — R002 Palpitations: Secondary | ICD-10-CM | POA: Diagnosis not present

## 2022-06-27 NOTE — Progress Notes (Unsigned)
Enrolled for [employer] to mail a ZIO XT long term holter monitor to the patients address on file.  

## 2022-06-27 NOTE — Patient Instructions (Signed)
Medication Instructions:  Your physician recommends that you continue on your current medications as directed. Please refer to the Current Medication list given to you today.  *If you need a refill on your cardiac medications before your next appointment, please call your pharmacy*   Lab Work: None ordered   If you have labs (blood work) drawn today and your tests are completely normal, you will receive your results only by: MyChart Message (if you have MyChart) OR A paper copy in the mail If you have any lab test that is abnormal or we need to change your treatment, we will call you to review the results.   Testing/Procedures: Your physician has requested that you have an echocardiogram. Echocardiography is a painless test that uses sound waves to create images of your heart. It provides your doctor with information about the size and shape of your heart and how well your heart's chambers and valves are working. This procedure takes approximately one hour. There are no restrictions for this procedure. Please do NOT wear cologne, perfume, aftershave, or lotions (deodorant is allowed). Please arrive 15 minutes prior to your appointment time.  A zio monitor was ordered today. It will remain on for 14 days. You will then return monitor and event diary in provided box. It takes 1-2 weeks for report to be downloaded and returned to [place]. We will call you with the results. If monitor falls off or has orange flashing light, please call Z[employer] for further instructions.     Follow-Up: At [facility], you and your health needs are our priority.  As part of our continuing mission to provide you with exceptional heart care, we have created designated Provider Care Teams.  These Care Teams include your primary Cardiologist (physician) and Advanced Practice Providers (APPs -  Physician Assistants and Nurse Practitioners) who all work together to provide you with the care you need, when you need  it.  We recommend signing up for the patient portal called "MyChart".  Sign up information is provided on this After Visit Summary.  MyChart is used to connect with patients for Virtual Visits (Telemedicine).  Patients are able to view lab/test results, encounter notes, upcoming appointments, etc.  Non-urgent messages can be sent to your provider as well.   To learn more about what you can do with MyChart, go to [email or web address].    Your next appointment:   6 week(s)  The format for your next appointment:   In Person  Provider:   Verne Carrow, MD   Other Instructions ZIO XT- Long Term Monitor Instructions  Your physician has requested you wear a ZIO patch monitor for 14 days.  This is a single patch monitor. [employer] supplies one patch monitor per enrollment. Additional stickers are not available. Please do not apply patch if you will be having a Nuclear Stress Test,  Echocardiogram, Cardiac CT, MRI, or Chest Xray during the period you would be wearing the  monitor. The patch cannot be worn during these tests. You cannot remove and re-apply the  ZIO XT patch monitor.  Your ZIO patch monitor will be mailed 3 day USPS to your address on file. It may take 3-5 days  to receive your monitor after you have been enrolled.  Once you have received your monitor, please review the enclosed instructions. Your monitor  has already been registered assigning a specific monitor serial # to you.  Billing and Patient Assistance Program Information  We have supplied [employer] with any of your  insurance information on file for billing purposes. [employer] offers a sliding scale Patient Assistance Program for patients that do not have  insurance, or whose insurance does not completely cover the cost of the ZIO monitor.  You must apply for the Patient Assistance Program to qualify for this discounted rate.  To apply, please call [employer] at [phone], select option 4, select option 2, ask to  apply for  Patient Assistance Program. Theodore Demark will ask your household income, and how many people  are in your household. They will quote your out-of-pocket cost based on that information.  [employer] will also be able to set up a 37-month, interest-free payment plan if needed.  Applying the monitor   Shave hair from upper left chest.  Hold abrader disc by orange tab. Rub abrader in 40 strokes over the upper left chest as  indicated in your monitor instructions.  Clean area with 4 enclosed alcohol pads. Let dry.  Apply patch as indicated in monitor instructions. Patch will be placed under collarbone on left  side of chest with arrow pointing upward.  Rub patch adhesive wings for 2 minutes. Remove white label marked "1". Remove the white  label marked "2". Rub patch adhesive wings for 2 additional minutes.  While looking in a mirror, press and release button in center of patch. A small green light will  flash 3-4 times. This will be your only indicator that the monitor has been turned on.  Do not shower for the first 24 hours. You may shower after the first 24 hours.  Press the button if you feel a symptom. You will hear a small click. Record Date, Time and  Symptom in the Patient Logbook.  When you are ready to remove the patch, follow instructions on the last 2 pages of Patient  Logbook. Stick patch monitor onto the last page of Patient Logbook.  Place Patient Logbook in the blue and white box. Use locking tab on box and tape box closed  securely. The blue and white box has prepaid postage on it. Please place it in the mailbox as  soon as possible. Your physician should have your test results approximately 7 days after the  monitor has been mailed back to [facility].  Call [place] at [phone] if you have questions regarding  your ZIO XT patch monitor. Call them immediately if you see an orange light blinking on your  monitor.  If your monitor falls off in  less than 4 days, contact our Monitor department at [phone].  If your monitor becomes loose or falls off after 4 days call [employer] at [phone] for  suggestions on securing your monitor   Important Information About [employer]

## 2022-07-04 ENCOUNTER — Other Ambulatory Visit: Payer: Self-pay | Admitting: Nurse Practitioner

## 2022-07-04 DIAGNOSIS — Z1231 Encounter for screening mammogram for malignant neoplasm of breast: Secondary | ICD-10-CM

## 2022-07-10 ENCOUNTER — Ambulatory Visit (HOSPITAL_COMMUNITY): Payer: Commercial Managed Care - HMO | Attending: Cardiovascular Disease

## 2022-07-10 DIAGNOSIS — R002 Palpitations: Secondary | ICD-10-CM | POA: Diagnosis present

## 2022-07-10 LAB — ECHOCARDIOGRAM COMPLETE
Area-P 1/2: 2.92 cm2
S' Lateral: 2.8 cm

## 2022-07-12 ENCOUNTER — Other Ambulatory Visit (HOSPITAL_COMMUNITY): Payer: Commercial Managed Care - HMO

## 2022-07-12 DIAGNOSIS — R002 Palpitations: Secondary | ICD-10-CM

## 2022-07-17 ENCOUNTER — Ambulatory Visit: Payer: Self-pay | Admitting: Cardiovascular Disease

## 2022-08-08 ENCOUNTER — Ambulatory Visit: Payer: Commercial Managed Care - HMO | Admitting: Cardiovascular Disease

## 2023-11-14 DIAGNOSIS — R6882 Decreased libido: Secondary | ICD-10-CM | POA: Diagnosis not present

## 2023-11-14 DIAGNOSIS — N951 Menopausal and female climacteric states: Secondary | ICD-10-CM | POA: Diagnosis not present

## 2023-11-14 DIAGNOSIS — R5382 Chronic fatigue, unspecified: Secondary | ICD-10-CM | POA: Diagnosis not present

## 2024-02-21 DIAGNOSIS — Z79890 Hormone replacement therapy (postmenopausal): Secondary | ICD-10-CM | POA: Diagnosis not present
